# Patient Record
Sex: Female | Born: 1985 | Race: White | Hispanic: No | Marital: Married | Smoking: Never smoker
Health system: Southern US, Community
[De-identification: ages and names within clinical notes are randomized; demographics above are authoritative.]

## PROBLEM LIST (undated history)

## (undated) ENCOUNTER — Inpatient Hospital Stay (HOSPITAL_COMMUNITY): Payer: Self-pay

## (undated) DIAGNOSIS — J45909 Unspecified asthma, uncomplicated: Secondary | ICD-10-CM

## (undated) DIAGNOSIS — R569 Unspecified convulsions: Secondary | ICD-10-CM

## (undated) DIAGNOSIS — L309 Dermatitis, unspecified: Secondary | ICD-10-CM

## (undated) DIAGNOSIS — Z789 Other specified health status: Secondary | ICD-10-CM

## (undated) DIAGNOSIS — IMO0002 Reserved for concepts with insufficient information to code with codable children: Secondary | ICD-10-CM

## (undated) DIAGNOSIS — T8859XA Other complications of anesthesia, initial encounter: Secondary | ICD-10-CM

## (undated) DIAGNOSIS — T4145XA Adverse effect of unspecified anesthetic, initial encounter: Secondary | ICD-10-CM

## (undated) HISTORY — PX: NO PAST SURGERIES: SHX2092

## (undated) HISTORY — DX: Adverse effect of unspecified anesthetic, initial encounter: T41.45XA

## (undated) HISTORY — PX: HAND SURGERY: SHX662

## (undated) HISTORY — DX: Unspecified convulsions: R56.9

## (undated) HISTORY — DX: Unspecified asthma, uncomplicated: J45.909

## (undated) HISTORY — PX: WISDOM TOOTH EXTRACTION: SHX21

## (undated) HISTORY — DX: Reserved for concepts with insufficient information to code with codable children: IMO0002

## (undated) HISTORY — DX: Dermatitis, unspecified: L30.9

## (undated) HISTORY — DX: Other complications of anesthesia, initial encounter: T88.59XA

---

## 2007-07-30 ENCOUNTER — Emergency Department (HOSPITAL_COMMUNITY): Admission: EM | Admit: 2007-07-30 | Discharge: 2007-07-30 | Payer: Self-pay | Admitting: Emergency Medicine

## 2009-01-26 ENCOUNTER — Encounter: Admission: RE | Admit: 2009-01-26 | Discharge: 2009-01-26 | Payer: Self-pay | Admitting: Family Medicine

## 2010-06-14 ENCOUNTER — Other Ambulatory Visit: Admission: RE | Admit: 2010-06-14 | Discharge: 2010-06-14 | Payer: Self-pay | Admitting: Obstetrics and Gynecology

## 2011-06-15 ENCOUNTER — Other Ambulatory Visit (HOSPITAL_COMMUNITY)
Admission: RE | Admit: 2011-06-15 | Discharge: 2011-06-15 | Disposition: A | Discharge status: Home / Self Care | Payer: BC Managed Care – PPO | Source: Ambulatory Visit | Attending: Obstetrics and Gynecology | Admitting: Obstetrics and Gynecology

## 2011-06-15 DIAGNOSIS — Z113 Encounter for screening for infections with a predominantly sexual mode of transmission: Secondary | ICD-10-CM | POA: Insufficient documentation

## 2011-06-15 DIAGNOSIS — Z01419 Encounter for gynecological examination (general) (routine) without abnormal findings: Secondary | ICD-10-CM | POA: Insufficient documentation

## 2011-12-28 ENCOUNTER — Other Ambulatory Visit: Payer: Self-pay | Admitting: Obstetrics and Gynecology

## 2011-12-28 DIAGNOSIS — N979 Female infertility, unspecified: Secondary | ICD-10-CM

## 2012-01-04 ENCOUNTER — Ambulatory Visit (HOSPITAL_COMMUNITY)
Admission: RE | Admit: 2012-01-04 | Discharge: 2012-01-04 | Disposition: A | Discharge status: Home / Self Care | Payer: BC Managed Care – PPO | Source: Ambulatory Visit | Attending: Obstetrics and Gynecology | Admitting: Obstetrics and Gynecology

## 2012-01-04 DIAGNOSIS — N979 Female infertility, unspecified: Secondary | ICD-10-CM

## 2012-01-04 MED ORDER — IOHEXOL 300 MG/ML  SOLN: 5.0000 mL | Freq: Once | INTRAMUSCULAR | Status: AC | PRN

## 2012-03-22 ENCOUNTER — Encounter: Payer: Self-pay | Admitting: Physician Assistant

## 2012-03-22 ENCOUNTER — Ambulatory Visit (INDEPENDENT_AMBULATORY_CARE_PROVIDER_SITE_OTHER): Payer: BC Managed Care – PPO | Admitting: Physician Assistant

## 2012-03-22 VITALS — BP 132/77 | HR 73 | Temp 98.0°F | Resp 16 | Ht 65.5 in | Wt 148.0 lb

## 2012-03-22 DIAGNOSIS — L299 Pruritus, unspecified: Secondary | ICD-10-CM

## 2012-03-22 DIAGNOSIS — L255 Unspecified contact dermatitis due to plants, except food: Secondary | ICD-10-CM

## 2012-03-22 DIAGNOSIS — R21 Rash and other nonspecific skin eruption: Secondary | ICD-10-CM

## 2012-03-22 DIAGNOSIS — L237 Allergic contact dermatitis due to plants, except food: Secondary | ICD-10-CM

## 2012-03-22 LAB — GLUCOSE, POCT (MANUAL RESULT ENTRY): POC Glucose: 95

## 2012-03-22 MED ORDER — TRIAMCINOLONE ACETONIDE 0.1 % EX CREA: TOPICAL_CREAM | CUTANEOUS | Status: AC

## 2012-03-22 MED ORDER — PREDNISONE 20 MG PO TABS: ORAL_TABLET | ORAL | Status: AC

## 2012-03-22 NOTE — Progress Notes (Signed)
Patient ID: Carol Rush MRN: [id number], DOB: [birth year], 26 y.o. Date of Encounter: 03/22/2012, 5:41 PM  Primary Physician: No primary provider on file.  Chief Complaint: Pruritic rash  HPI: 26 y.o. year old female with presents with 3 day history of mildly erythematous pruritic rash along bilateral arms, abdomen, and legs. Patient was washing her dog that had been out in the woods prior to the development of the rash. Known poison ivy in the vicinity. Has washed all clothing and linens that have been exposed, including the dog. Lesions are drying secondary to increased washing with soap. Has tried Benadryl and Zyrtec. Patient is otherwise doing well without issues or complaints.  Past Medical History  Diagnosis Date  . Eczema      Home Meds: Prior to Admission medications   Not on File    Allergies: No Known Allergies  History   Social History  . Marital Status: Married    Spouse Name: N/A    Number of Children: N/A  . Years of Education: N/A   Occupational History  . Not on file.   Social History Main Topics  . Smoking status: Never Smoker   . Smokeless tobacco: Not on file  . Alcohol Use: Not on file  . Drug Use: Not on file  . Sexually Active: Not on file   Other Topics Concern  . Not on file   Social History Narrative  . No narrative on file     Review of Systems: Constitutional: negative for chills, fever, night sweats, weight changes, or fatigue  HEENT: negative for vision changes, hearing loss, congestion, rhinorrhea, ST, epistaxis, or sinus pressure Cardiovascular: negative for chest pain or palpitations Respiratory: negative for hemoptysis, wheezing, shortness of breath, or cough Abdominal: negative for abdominal pain, nausea, vomiting, diarrhea, or constipation Dermatological: see above Neurologic: negative for headache, dizziness, or syncope   Physical Exam: Blood pressure 132/77, pulse 73, temperature 98 F (36.7 C), temperature source  Oral, resp. rate 16, height 5' 5.5" (1.664 m), weight 148 lb (67.132 kg), last menstrual period 02/27/2012., Body mass index is 24.25 kg/(m^2). General: Well developed, well nourished, in no acute distress. Head: Normocephalic, atraumatic, eyes without discharge, sclera non-icteric, nares are without discharge. Bilateral auditory canals clear, TM's are without perforation, pearly grey and translucent with reflective cone of light bilaterally. Oral cavity moist, posterior pharynx without exudate, erythema, peritonsillar abscess, or post nasal drip.  Neck: Supple. No thyromegaly. Full ROM. No lymphadenopathy. Lungs: Clear bilaterally to auscultation without wheezes, rales, or rhonchi. Breathing is unlabored. Heart: RRR with S1 S2. No murmurs, rubs, or gallops appreciated. Msk:  Strength and tone normal for age. Extremities/Skin: Warm and dry. Multiple vesicular lesions along bilateral arms, legs, and abdomen consistent with poison ivy. No secondary infections present. No clubbing or cyanosis. No edema.  Neuro: Alert and oriented X 3. Moves all extremities spontaneously. Gait is normal. CNII-XII grossly in tact. Psych:  Responds to questions appropriately with a normal affect.   Labs: Results for orders placed in visit on 03/22/12  GLUCOSE, POCT (MANUAL RESULT ENTRY)      Component Value Range   POC Glucose 95       ASSESSMENT AND PLAN:  26 y.o. year old female with poison ivy -Prednisone 20 mg #18 3x3, 2x3, 1x3 no RF -Triamcinolone topical cream 0.1% Apply to affected area bid #45 grams RF 4 -Zyrtec -Zantac -Benadryl -RTC 10 days if symptoms persist, sooner if they worsen   Signed, Eula Listen, PA-C 03/22/2012  5:41 PM

## 2012-06-11 ENCOUNTER — Ambulatory Visit (INDEPENDENT_AMBULATORY_CARE_PROVIDER_SITE_OTHER): Payer: BC Managed Care – PPO | Admitting: Physician Assistant

## 2012-06-11 VITALS — BP 108/68 | HR 64 | Temp 98.6°F | Resp 16 | Ht 65.5 in | Wt 150.8 lb

## 2012-06-11 DIAGNOSIS — L239 Allergic contact dermatitis, unspecified cause: Secondary | ICD-10-CM

## 2012-06-11 DIAGNOSIS — L03119 Cellulitis of unspecified part of limb: Secondary | ICD-10-CM

## 2012-06-11 DIAGNOSIS — T6391XA Toxic effect of contact with unspecified venomous animal, accidental (unintentional), initial encounter: Secondary | ICD-10-CM

## 2012-06-11 DIAGNOSIS — L259 Unspecified contact dermatitis, unspecified cause: Secondary | ICD-10-CM

## 2012-06-11 DIAGNOSIS — T63441A Toxic effect of venom of bees, accidental (unintentional), initial encounter: Secondary | ICD-10-CM

## 2012-06-11 DIAGNOSIS — IMO0002 Reserved for concepts with insufficient information to code with codable children: Secondary | ICD-10-CM

## 2012-06-11 MED ORDER — PREDNISONE 20 MG PO TABS: ORAL_TABLET | ORAL | Status: AC

## 2012-06-11 MED ORDER — EPINEPHRINE 0.3 MG/0.3ML IJ DEVI: 0.3000 mg | Freq: Once | INTRAMUSCULAR | Status: DC

## 2012-06-11 MED ORDER — CEPHALEXIN 500 MG PO CAPS: 500.0000 mg | ORAL_CAPSULE | Freq: Two times a day (BID) | ORAL | Status: AC

## 2012-06-11 NOTE — Progress Notes (Signed)
  Subjective:    Patient ID: Carol Rush, female    DOB: May 09, 1986, 26 y.o.   MRN: [id number]  HPI Patient presents with wasp sting on 06/09/12.  Says she washed the area with soap and water and put ice on it. Since then she has had increasing redness, warmth, and swelling.  She has been taking benadryl 25 mg q4hours which has helped with the itching but has helped warmth or swelling.  She has continued using ice. No fevers, chills, nausea, vomiting, abdominal pain, headache, or drainage from the wound.     Review of Systems  All other systems reviewed and are negative.       Objective:   Physical Exam  Constitutional: She is oriented to person, place, and time. She appears well-developed and well-nourished.  HENT:  Head: Normocephalic and atraumatic.  Right Ear: External ear normal.  Left Ear: External ear normal.  Eyes: Conjunctivae are normal.  Neck: Normal range of motion.  Cardiovascular: Normal rate, regular rhythm and normal heart sounds.   Pulmonary/Chest: Effort normal and breath sounds normal.  Neurological: She is alert and oriented to person, place, and time.  Skin:     Psychiatric: She has a normal mood and affect. Her behavior is normal. Judgment and thought content normal.          Assessment & Plan:   1. Bee sting  EPINEPHrine (EPIPEN) 0.3 mg/0.3 mL DEVI  2. Allergic dermatitis  predniSONE (DELTASONE) 20 MG tablet  3. Cellulitis of arm  cephALEXin (KEFLEX) 500 MG capsule   Will treat with prednisone taper.  Continue benadryl 50 mg qhs and Zyrtec daily qam.   Cover with Keflex 500 mg bid x 7 days.   Follow up if symptoms worsen or fail to improve.

## 2012-11-08 LAB — OB RESULTS CONSOLE RPR: RPR: NONREACTIVE

## 2012-11-08 LAB — OB RESULTS CONSOLE RUBELLA ANTIBODY, IGM: Rubella: NON-IMMUNE/NOT IMMUNE

## 2012-11-08 LAB — OB RESULTS CONSOLE HIV ANTIBODY (ROUTINE TESTING): HIV: NONREACTIVE

## 2013-04-15 ENCOUNTER — Institutional Professional Consult (permissible substitution): Payer: BC Managed Care – PPO | Admitting: Pediatrics

## 2013-04-18 ENCOUNTER — Ambulatory Visit (INDEPENDENT_AMBULATORY_CARE_PROVIDER_SITE_OTHER): Payer: BC Managed Care – PPO | Admitting: Pediatrics

## 2013-04-18 DIAGNOSIS — Z7681 Expectant parent(s) prebirth pediatrician visit: Secondary | ICD-10-CM

## 2013-04-18 NOTE — Progress Notes (Signed)
Met with expectant first-time mother,  Due July 23rd, 2014, unknown gender Discussed practice logistics, clinic hours, after hours contact Well child schedule, vaccination schedule Answered questions

## 2013-05-06 LAB — OB RESULTS CONSOLE GBS: GBS: NEGATIVE

## 2013-06-03 ENCOUNTER — Encounter (HOSPITAL_COMMUNITY): Payer: Self-pay | Admitting: *Deleted

## 2013-06-03 ENCOUNTER — Inpatient Hospital Stay (HOSPITAL_COMMUNITY)
Admission: AD | Admit: 2013-06-03 | Discharge: 2013-06-03 | Disposition: A | Discharge status: Home / Self Care | Payer: BC Managed Care – PPO | Source: Ambulatory Visit | Attending: Obstetrics and Gynecology | Admitting: Obstetrics and Gynecology

## 2013-06-03 DIAGNOSIS — O479 False labor, unspecified: Secondary | ICD-10-CM | POA: Insufficient documentation

## 2013-06-03 HISTORY — DX: Other specified health status: Z78.9

## 2013-06-03 NOTE — MAU Note (Signed)
Pt C/O uc's since 2000 last night, more intense than before, denies bleeding or LOF.

## 2013-06-04 ENCOUNTER — Encounter (HOSPITAL_COMMUNITY): Payer: Self-pay | Admitting: *Deleted

## 2013-06-04 ENCOUNTER — Inpatient Hospital Stay (HOSPITAL_COMMUNITY): Payer: BC Managed Care – PPO | Admitting: Anesthesiology

## 2013-06-04 ENCOUNTER — Inpatient Hospital Stay (HOSPITAL_COMMUNITY)
Admission: AD | Admit: 2013-06-04 | Discharge: 2013-06-06 | DRG: 373 | Disposition: A | Discharge status: Home / Self Care | Payer: BC Managed Care – PPO | Source: Ambulatory Visit | Attending: Obstetrics and Gynecology | Admitting: Obstetrics and Gynecology

## 2013-06-04 ENCOUNTER — Encounter (HOSPITAL_COMMUNITY): Payer: Self-pay | Admitting: Anesthesiology

## 2013-06-04 LAB — CBC
MCH: 30.4 pg (ref 26.0–34.0)
MCHC: 34.8 g/dL (ref 30.0–36.0)
MCV: 87.2 fL (ref 78.0–100.0)
Platelets: 206 10*3/uL (ref 150–400)
RDW: 12.4 % (ref 11.5–15.5)

## 2013-06-04 LAB — TYPE AND SCREEN
ABO/RH(D): B POS
Antibody Screen: NEGATIVE

## 2013-06-04 LAB — ABO/RH: ABO/RH(D): B POS

## 2013-06-04 MED ORDER — ONDANSETRON HCL 4 MG/2ML IJ SOLN
4.0000 mg | Freq: Four times a day (QID) | INTRAMUSCULAR | Status: DC | PRN
  Administered 2013-06-04: 4 mg via INTRAVENOUS
  Filled 2013-06-04: qty 2

## 2013-06-04 MED ORDER — OXYTOCIN 40 UNITS IN LACTATED RINGERS INFUSION - SIMPLE MED: 1.0000 m[IU]/min | INTRAVENOUS | Status: DC

## 2013-06-04 MED ORDER — SENNOSIDES-DOCUSATE SODIUM 8.6-50 MG PO TABS
2.0000 | ORAL_TABLET | Freq: Every day | ORAL | Status: DC
  Administered 2013-06-05: 2 via ORAL

## 2013-06-04 MED ORDER — ONDANSETRON HCL 4 MG/2ML IJ SOLN: 4.0000 mg | INTRAMUSCULAR | Status: DC | PRN

## 2013-06-04 MED ORDER — FENTANYL 2.5 MCG/ML BUPIVACAINE 1/10 % EPIDURAL INFUSION (WH - ANES)
INTRAMUSCULAR | Status: DC | PRN
  Administered 2013-06-04: 14 mL/h via EPIDURAL

## 2013-06-04 MED ORDER — CITRIC ACID-SODIUM CITRATE 334-500 MG/5ML PO SOLN: 30.0000 mL | ORAL | Status: DC | PRN

## 2013-06-04 MED ORDER — PRENATAL MULTIVITAMIN CH
1.0000 | ORAL_TABLET | Freq: Every day | ORAL | Status: DC
  Administered 2013-06-05: 1 via ORAL
  Filled 2013-06-04: qty 1

## 2013-06-04 MED ORDER — WITCH HAZEL-GLYCERIN EX PADS: 1.0000 "application " | MEDICATED_PAD | CUTANEOUS | Status: DC | PRN

## 2013-06-04 MED ORDER — IBUPROFEN 600 MG PO TABS
600.0000 mg | ORAL_TABLET | Freq: Four times a day (QID) | ORAL | Status: DC
  Administered 2013-06-04 – 2013-06-06 (×7): 600 mg via ORAL
  Filled 2013-06-04 (×6): qty 1

## 2013-06-04 MED ORDER — LIDOCAINE HCL (PF) 1 % IJ SOLN
INTRAMUSCULAR | Status: DC | PRN
  Administered 2013-06-04 (×2): 4 mL

## 2013-06-04 MED ORDER — DIPHENHYDRAMINE HCL 50 MG/ML IJ SOLN: 12.5000 mg | INTRAMUSCULAR | Status: DC | PRN

## 2013-06-04 MED ORDER — MEDROXYPROGESTERONE ACETATE 150 MG/ML IM SUSP: 150.0000 mg | INTRAMUSCULAR | Status: DC | PRN

## 2013-06-04 MED ORDER — ONDANSETRON HCL 4 MG PO TABS: 4.0000 mg | ORAL_TABLET | ORAL | Status: DC | PRN

## 2013-06-04 MED ORDER — LACTATED RINGERS IV SOLN
500.0000 mL | INTRAVENOUS | Status: DC | PRN
  Administered 2013-06-04: 1000 mL via INTRAVENOUS

## 2013-06-04 MED ORDER — LIDOCAINE HCL (PF) 1 % IJ SOLN
30.0000 mL | INTRAMUSCULAR | Status: DC | PRN
  Filled 2013-06-04 (×2): qty 30

## 2013-06-04 MED ORDER — OXYTOCIN 40 UNITS IN LACTATED RINGERS INFUSION - SIMPLE MED
62.5000 mL/h | INTRAVENOUS | Status: DC
  Filled 2013-06-04: qty 1000

## 2013-06-04 MED ORDER — OXYCODONE-ACETAMINOPHEN 5-325 MG PO TABS: 1.0000 | ORAL_TABLET | ORAL | Status: DC | PRN

## 2013-06-04 MED ORDER — BENZOCAINE-MENTHOL 20-0.5 % EX AERO
1.0000 "application " | INHALATION_SPRAY | CUTANEOUS | Status: DC | PRN
  Filled 2013-06-04: qty 56

## 2013-06-04 MED ORDER — TETANUS-DIPHTH-ACELL PERTUSSIS 5-2.5-18.5 LF-MCG/0.5 IM SUSP: 0.5000 mL | Freq: Once | INTRAMUSCULAR | Status: DC

## 2013-06-04 MED ORDER — FENTANYL 2.5 MCG/ML BUPIVACAINE 1/10 % EPIDURAL INFUSION (WH - ANES)
14.0000 mL/h | INTRAMUSCULAR | Status: DC | PRN
  Administered 2013-06-04: 14 mL/h via EPIDURAL
  Filled 2013-06-04 (×2): qty 125

## 2013-06-04 MED ORDER — EPHEDRINE 5 MG/ML INJ
10.0000 mg | INTRAVENOUS | Status: DC | PRN
  Filled 2013-06-04: qty 2

## 2013-06-04 MED ORDER — ACETAMINOPHEN 325 MG PO TABS: 650.0000 mg | ORAL_TABLET | ORAL | Status: DC | PRN

## 2013-06-04 MED ORDER — MEASLES, MUMPS & RUBELLA VAC ~~LOC~~ INJ
0.5000 mL | INJECTION | Freq: Once | SUBCUTANEOUS | Status: AC
  Administered 2013-06-06: 0.5 mL via SUBCUTANEOUS
  Filled 2013-06-04 (×2): qty 0.5

## 2013-06-04 MED ORDER — IBUPROFEN 600 MG PO TABS: 600.0000 mg | ORAL_TABLET | Freq: Four times a day (QID) | ORAL | Status: DC | PRN

## 2013-06-04 MED ORDER — PHENYLEPHRINE 40 MCG/ML (10ML) SYRINGE FOR IV PUSH (FOR BLOOD PRESSURE SUPPORT)
80.0000 ug | PREFILLED_SYRINGE | INTRAVENOUS | Status: DC | PRN
  Filled 2013-06-04: qty 2

## 2013-06-04 MED ORDER — DIBUCAINE 1 % RE OINT: 1.0000 "application " | TOPICAL_OINTMENT | RECTAL | Status: DC | PRN

## 2013-06-04 MED ORDER — LACTATED RINGERS IV SOLN
INTRAVENOUS | Status: DC
  Administered 2013-06-04 (×2): 1000 mL via INTRAVENOUS

## 2013-06-04 MED ORDER — LANOLIN HYDROUS EX OINT: TOPICAL_OINTMENT | CUTANEOUS | Status: DC | PRN

## 2013-06-04 MED ORDER — SIMETHICONE 80 MG PO CHEW: 80.0000 mg | CHEWABLE_TABLET | ORAL | Status: DC | PRN

## 2013-06-04 MED ORDER — FLEET ENEMA 7-19 GM/118ML RE ENEM: 1.0000 | ENEMA | RECTAL | Status: DC | PRN

## 2013-06-04 MED ORDER — DIPHENHYDRAMINE HCL 25 MG PO CAPS: 25.0000 mg | ORAL_CAPSULE | Freq: Four times a day (QID) | ORAL | Status: DC | PRN

## 2013-06-04 MED ORDER — OXYTOCIN BOLUS FROM INFUSION: 500.0000 mL | INTRAVENOUS | Status: DC

## 2013-06-04 MED ORDER — EPHEDRINE 5 MG/ML INJ
10.0000 mg | INTRAVENOUS | Status: DC | PRN
  Filled 2013-06-04: qty 4
  Filled 2013-06-04: qty 2

## 2013-06-04 MED ORDER — TERBUTALINE SULFATE 1 MG/ML IJ SOLN: 0.2500 mg | Freq: Once | INTRAMUSCULAR | Status: DC | PRN

## 2013-06-04 MED ORDER — LACTATED RINGERS IV SOLN: 500.0000 mL | Freq: Once | INTRAVENOUS | Status: DC

## 2013-06-04 MED ORDER — PHENYLEPHRINE 40 MCG/ML (10ML) SYRINGE FOR IV PUSH (FOR BLOOD PRESSURE SUPPORT)
80.0000 ug | PREFILLED_SYRINGE | INTRAVENOUS | Status: DC | PRN
  Filled 2013-06-04: qty 5
  Filled 2013-06-04: qty 2

## 2013-06-04 NOTE — Anesthesia Preprocedure Evaluation (Signed)

## 2013-06-04 NOTE — Anesthesia Procedure Notes (Signed)
Epidural Patient location during procedure: OB Start time: 06/04/2013 1:31 PM  Staffing Anesthesiologist: Babbette Dalesandro A. Performed by: anesthesiologist   Preanesthetic Checklist Completed: patient identified, site marked, surgical consent, pre-op evaluation, timeout performed, IV checked, risks and benefits discussed and monitors and equipment checked  Epidural Patient position: sitting Prep: site prepped and draped and DuraPrep Patient monitoring: continuous pulse ox and blood pressure Approach: midline Injection technique: LOR air  Needle:  Needle type: Tuohy  Needle gauge: 17 G Needle length: 9 cm and 9 Needle insertion depth: 4 cm Catheter type: closed end flexible Catheter size: 19 Gauge Catheter at skin depth: 9 cm Test dose: negative and Other  Assessment Events: blood not aspirated, injection not painful, no injection resistance, negative IV test and no paresthesia  Additional Notes Patient identified. Risks and benefits discussed including failed block, incomplete  Pain control, post dural puncture headache, nerve damage, paralysis, blood pressure Changes, nausea, vomiting, reactions to medications-both toxic and allergic and post Partum back pain. All questions were answered. Patient expressed understanding and wished to proceed. Sterile technique was used throughout procedure. Epidural site was Dressed with sterile barrier dressing. No paresthesias, signs of intravascular injection Or signs of intrathecal spread were encountered.  Patient was more comfortable after the epidural was dosed. Please see RN's note for documentation of vital signs and FHR which are stable.

## 2013-06-04 NOTE — Progress Notes (Signed)
Pt comfortable w/ epidural  FHT reassuring Toco Q3 Cvx 6cm per RN check  A/P:  Labor Exp mngt

## 2013-06-04 NOTE — Progress Notes (Signed)
Pt comfortable  FHT reassuring Toco Q2-3 Cvx loose right lip, slips behind head w/ push.  0 station  A/P:  Labor Exp [facility]

## 2013-06-04 NOTE — Progress Notes (Signed)
SVD of vigerous female infant w/ apgars of 9,9.  Placenta delivered spontaneous w/ 3VC.   2nd degree & right labial lac repaired w/ 3-0 vicryl rapide.  Fundus firm.  EBL 350cc .

## 2013-06-04 NOTE — H&P (Signed)
Carol Rush is a 27 y.o. female presenting for labor.  Regular contractions that began this morning.  No vb or lof.   History OB History   Grav Para Term Preterm Abortions TAB SAB Ect Mult Living   1              Past Medical History  Diagnosis Date  . Eczema   . Medical history non-contributory    Past Surgical History  Procedure Laterality Date  . No past surgeries     Family History: family history is not on file. Social History:  reports that she has never smoked. She does not have any smokeless tobacco history on file. She reports that she does not drink alcohol or use illicit drugs.   Prenatal Transfer Tool  Maternal Diabetes: No Genetic Screening: Declined Maternal Ultrasounds/Referrals: Normal Fetal Ultrasounds or other Referrals:  None Maternal Substance Abuse:  No Significant Maternal Medications:  None Significant Maternal Lab Results:  None Other Comments:  None  ROS  Dilation: 4.5 Effacement (%): 90 Station: -2 Exam by:: dr Renaldo Fiddler Blood pressure 120/81, pulse 70, temperature 98.5 F (36.9 C), temperature source Oral, resp. rate 18, height 5\' 6"  (1.676 m), weight 77.111 kg (170 lb), last menstrual period 05/31/2012. Exam Physical Exam  Prenatal labs: ABO, Rh: B/Positive/-- (12/26 0000) Antibody: Negative (12/26 0000) Rubella: Nonimmune (12/26 0000) RPR: Nonreactive (12/26 0000)  HBsAg: Negative (12/26 0000)  HIV: Non-reactive (12/26 0000)  GBS: Negative (06/23 0000)   Assessment/Plan: Admit Arom - clear Exp mngt Epidural prn   Dmiyah Liscano 06/04/2013, 12:41 PM

## 2013-06-05 LAB — CBC
MCHC: 34.8 g/dL (ref 30.0–36.0)
RDW: 12.4 % (ref 11.5–15.5)

## 2013-06-05 NOTE — Progress Notes (Cosign Needed)
Post Partum Day 1 Subjective: no complaints, up ad lib, voiding and tolerating PO  Objective: Blood pressure 119/76, pulse 62, temperature 98.1 F (36.7 C), temperature source Oral, resp. rate 20, height 5\' 6"  (1.676 m), weight 170 lb (77.111 kg), last menstrual period 05/31/2012, SpO2 95.00%, unknown if currently breastfeeding.  Physical Exam:  General: alert and cooperative Lochia: appropriate Uterine Fundus: firm Incision: perineum intact, small labial edema DVT Evaluation: No evidence of DVT seen on physical exam. Negative Homan's sign. No cords or calf tenderness. No significant calf/ankle edema.   Recent Labs  06/04/13 1106 06/05/13 0610  HGB 12.6 10.5*  HCT 36.2 30.2*    Assessment/Plan: Plan for discharge tomorrow and Circumcision prior to discharge   LOS: 1 day   Kijana Estock G 06/05/2013, 8:03 AM

## 2013-06-05 NOTE — Progress Notes (Signed)
Admission nutrition screen triggered for weight loss. Pt with a net weight gain of 22 Lbs. Patients chart reviewed and assessed  for nutritional risk. Patient is determined to be at low nutrition  risk.   Elisabeth Cara M.Odis Luster LDN Neonatal Nutrition Support Specialist Pager [phone]

## 2013-06-06 ENCOUNTER — Encounter (HOSPITAL_COMMUNITY): Payer: Self-pay

## 2013-06-06 MED ORDER — IBUPROFEN 600 MG PO TABS: 600.0000 mg | ORAL_TABLET | Freq: Four times a day (QID) | ORAL | Status: DC

## 2013-06-06 NOTE — Discharge Summary (Signed)
Obstetric Discharge Summary Reason for Admission: onset of labor Prenatal Procedures: ultrasound Intrapartum Procedures: spontaneous vaginal delivery Postpartum Procedures: none Complications-Operative and Postpartum: 1 degree perineal laceration Hemoglobin  Date Value Range Status  06/05/2013 10.5* 12.0 - 15.0 g/dL Final     HCT  Date Value Range Status  06/05/2013 30.2* 36.0 - 46.0 % Final    Physical Exam:  General: alert and cooperative Lochia: appropriate Uterine Fundus: firm Incision: perineum intact DVT Evaluation: No evidence of DVT seen on physical exam. Negative Homan's sign. No cords or calf tenderness. No significant calf/ankle edema.  Discharge Diagnoses: Term Pregnancy-delivered  Discharge Information: Date: 06/06/2013 Activity: pelvic rest Diet: routine Medications: PNV and Ibuprofen Condition: stable Instructions: refer to practice specific booklet Discharge to: home   Newborn Data: Live born female  Birth Weight: 8 lb 8.5 oz (3870 g) APGAR: 9, 9  Home with mother.  Carol Rush G 06/06/2013, 8:33 AM

## 2013-06-07 NOTE — Progress Notes (Signed)
Post discharge chart review completed.  

## 2013-06-10 NOTE — Anesthesia Postprocedure Evaluation (Signed)
  Anesthesia Post-op Note  Patient: Carol Rush  Procedure(s) Performed: Lumbar Epidural for L&D  Complications: No apparent anesthesia complications

## 2014-09-15 ENCOUNTER — Encounter (HOSPITAL_COMMUNITY): Payer: Self-pay

## 2015-08-26 LAB — OB RESULTS CONSOLE ABO/RH: RH TYPE: POSITIVE

## 2015-08-26 LAB — OB RESULTS CONSOLE HIV ANTIBODY (ROUTINE TESTING): HIV: NONREACTIVE

## 2015-08-26 LAB — OB RESULTS CONSOLE GC/CHLAMYDIA
Chlamydia: NEGATIVE
GC PROBE AMP, GENITAL: NEGATIVE

## 2015-08-26 LAB — OB RESULTS CONSOLE RUBELLA ANTIBODY, IGM: Rubella: IMMUNE

## 2015-08-26 LAB — OB RESULTS CONSOLE HEPATITIS B SURFACE ANTIGEN: Hepatitis B Surface Ag: NEGATIVE

## 2015-08-26 LAB — OB RESULTS CONSOLE RPR: RPR: NONREACTIVE

## 2015-08-26 LAB — OB RESULTS CONSOLE ANTIBODY SCREEN: Antibody Screen: NEGATIVE

## 2015-11-15 NOTE — L&D Delivery Note (Signed)
SVD of VMI at 1454 on 03/28/16.  EBL 250cc.  Placenta to L&D. Head delivered LOA with tight nuchal x 1 which was delivered through.  Body delivered atraumatically.  Baby to abdomen.  Cord was clamped and cut.  Placenta delivered S/I/3VC with marginal cord insertion.  Fundus was firmed with pitocin and massage.  Perineum intact. Mom and baby stable.  Mitchel HonourMegan Jakalyn Kratky, DO

## 2015-12-01 ENCOUNTER — Other Ambulatory Visit (HOSPITAL_COMMUNITY): Payer: Self-pay | Admitting: Obstetrics and Gynecology

## 2015-12-01 ENCOUNTER — Encounter (HOSPITAL_COMMUNITY): Payer: Self-pay | Admitting: Obstetrics and Gynecology

## 2015-12-01 DIAGNOSIS — Z3689 Encounter for other specified antenatal screening: Secondary | ICD-10-CM

## 2015-12-01 DIAGNOSIS — O283 Abnormal ultrasonic finding on antenatal screening of mother: Secondary | ICD-10-CM

## 2015-12-01 DIAGNOSIS — Z3A24 24 weeks gestation of pregnancy: Secondary | ICD-10-CM

## 2015-12-08 ENCOUNTER — Encounter (HOSPITAL_COMMUNITY): Payer: Self-pay

## 2015-12-08 ENCOUNTER — Ambulatory Visit (HOSPITAL_COMMUNITY)
Admission: RE | Admit: 2015-12-08 | Discharge: 2015-12-08 | Disposition: A | Discharge status: Home / Self Care | Payer: BLUE CROSS/BLUE SHIELD | Source: Ambulatory Visit | Attending: Obstetrics and Gynecology | Admitting: Obstetrics and Gynecology

## 2015-12-08 ENCOUNTER — Other Ambulatory Visit (HOSPITAL_COMMUNITY): Payer: Self-pay | Admitting: Obstetrics and Gynecology

## 2015-12-08 DIAGNOSIS — Z3A24 24 weeks gestation of pregnancy: Secondary | ICD-10-CM

## 2015-12-08 DIAGNOSIS — IMO0002 Reserved for concepts with insufficient information to code with codable children: Secondary | ICD-10-CM

## 2015-12-08 DIAGNOSIS — Z3689 Encounter for other specified antenatal screening: Secondary | ICD-10-CM

## 2015-12-08 DIAGNOSIS — O43122 Velamentous insertion of umbilical cord, second trimester: Secondary | ICD-10-CM

## 2015-12-08 DIAGNOSIS — O283 Abnormal ultrasonic finding on antenatal screening of mother: Secondary | ICD-10-CM

## 2015-12-08 DIAGNOSIS — Z3A23 23 weeks gestation of pregnancy: Secondary | ICD-10-CM

## 2015-12-08 DIAGNOSIS — Z36 Encounter for antenatal screening of mother: Secondary | ICD-10-CM | POA: Insufficient documentation

## 2015-12-08 NOTE — Progress Notes (Signed)
MFM Consult, Staff Note:  There is an active singleton fetus without apparent dysmorphic features or markers of aneuploidy on targeted evaluation. The biometry is symmetric and agrees with the established dates.   The posterior placenta has a marginal cord insertion at the uterine fundus and no apparent velamentous (sub-membranous) cord insertion. There is no previa or vasa previa and the placenta is not low-lying. Marginal cord insertion rarely may be associated with fetal growth restriction and/or evolve into a velamentous insertion which is associated with cord rupture during placental extraction at delivery.   I recommend fetal growth assessment and evaluation of placenta/cord in 6 weeks. If no evidence of fetal growth restriction at the time of that exam, I recommend another growth ultrasound at approximately [redacted] weeks GA.   I explained the limitations of ultrasound for the diagnosis or exclusion of aneuploidy or anomalies. Today's discussion was limited to pertinent findings on the ultrasound.   As a courtesy, a repeat ultrasound for fetal growth and placenta/cord assessment has been scheduled in 6 weeks as this may evolve into velamentous insertion. Thank you for allowing our unit to contribute to your patient's care.  Time Spent:  I spent in excess of 20 minutes in consultation with this patient to review records, evaluate her case, and provide her with an adequate discussion and education.  More than 50% of this time was spent in direct face-to-face counseling.  It was a pleasure seeing your patient in the office today.  Thank you for consultation. Please do not hesitate to contact our service for any further questions.   Thank you,  Louann Sjogren Gaynelle Arabian, Louann Sjogren, MD, MS, FACOG Assistant Professor Section of Maternal-Fetal Medicine [facility]

## 2015-12-08 NOTE — Consult Note (Signed)
MFM Consult, Staff Note:  There is an active singleton fetus without apparent dysmorphic features or markers of aneuploidy on targeted evaluation. The biometry is symmetric and agrees with the established dates.   The posterior placenta has a marginal cord insertion at the uterine fundus and no apparent velamentous (sub-membranous) cord insertion. There is no previa or vasa previa and the placenta is not low-lying. Marginal cord insertion rarely may be associated with fetal growth restriction and/or evolve into a velamentous insertion which is associated with cord rupture during placental extraction at delivery.   I recommend fetal growth assessment and evaluation of placenta/cord in 6 weeks. If no evidence of fetal growth restriction at the time of that exam, I recommend another growth ultrasound at approximately [redacted] weeks GA.   I explained the limitations of ultrasound for the diagnosis or exclusion of aneuploidy or anomalies. Today's discussion was limited to pertinent findings on the ultrasound.   As a courtesy, a repeat ultrasound for fetal growth and placenta/cord assessment has been scheduled in 6 weeks as this may evolve into velamentous insertion. Thank you for allowing our unit to contribute to your patient's care.  Time Spent:  I spent in excess of 20 minutes in consultation with this patient to review records, evaluate her case, and provide her with an adequate discussion and education.  More than 50% of this time was spent in direct face-to-face counseling.  It was a pleasure seeing your patient in the office today.  Thank you for consultation. Please do not hesitate to contact our service for any further questions.   Thank you,  Marcelina Mclaurin Morgan Linton Stolp   Golden Emile Morgan, MD, MS, FACOG Assistant Professor Section of Maternal-Fetal Medicine [employer]      

## 2015-12-09 ENCOUNTER — Encounter (HOSPITAL_COMMUNITY): Payer: Self-pay

## 2015-12-09 ENCOUNTER — Other Ambulatory Visit (HOSPITAL_COMMUNITY): Payer: Self-pay

## 2016-01-27 ENCOUNTER — Ambulatory Visit (HOSPITAL_COMMUNITY): Admission: RE | Admit: 2016-01-27 | Payer: BLUE CROSS/BLUE SHIELD | Source: Ambulatory Visit

## 2016-03-03 LAB — OB RESULTS CONSOLE GBS: STREP GROUP B AG: NEGATIVE

## 2016-03-25 ENCOUNTER — Telehealth (HOSPITAL_COMMUNITY): Payer: Self-pay | Admitting: *Deleted

## 2016-03-25 ENCOUNTER — Encounter (HOSPITAL_COMMUNITY): Payer: Self-pay | Admitting: *Deleted

## 2016-03-25 NOTE — Telephone Encounter (Signed)
Preadmission screen  

## 2016-03-26 ENCOUNTER — Inpatient Hospital Stay (HOSPITAL_COMMUNITY)
Admission: AD | Admit: 2016-03-26 | Discharge: 2016-03-26 | Disposition: A | Discharge status: Home / Self Care | Payer: BLUE CROSS/BLUE SHIELD | Source: Ambulatory Visit | Attending: Obstetrics and Gynecology | Admitting: Obstetrics and Gynecology

## 2016-03-26 ENCOUNTER — Encounter (HOSPITAL_COMMUNITY): Payer: Self-pay | Admitting: *Deleted

## 2016-03-26 NOTE — MAU Note (Signed)
Contractions since 630 pm. Denies vaginal bleeding or leaking any fluids

## 2016-03-26 NOTE — Progress Notes (Signed)
Dr Henderson Cloudomblin notified of patients presenting complaints. Orders to d/c home when fetal heart tracing is reactive and reassuring

## 2016-03-28 ENCOUNTER — Inpatient Hospital Stay (HOSPITAL_COMMUNITY): Payer: BLUE CROSS/BLUE SHIELD | Admitting: Anesthesiology

## 2016-03-28 ENCOUNTER — Inpatient Hospital Stay (HOSPITAL_COMMUNITY)
Admission: RE | Admit: 2016-03-28 | Discharge: 2016-03-29 | DRG: 775 | Disposition: A | Discharge status: Home / Self Care | Payer: BLUE CROSS/BLUE SHIELD | Source: Ambulatory Visit | Attending: Obstetrics & Gynecology | Admitting: Obstetrics & Gynecology

## 2016-03-28 ENCOUNTER — Encounter (HOSPITAL_COMMUNITY): Payer: Self-pay

## 2016-03-28 VITALS — BP 114/75 | HR 67 | Temp 98.4°F | Resp 20 | Ht 65.5 in | Wt 173.0 lb

## 2016-03-28 DIAGNOSIS — Z3A39 39 weeks gestation of pregnancy: Secondary | ICD-10-CM

## 2016-03-28 DIAGNOSIS — O43123 Velamentous insertion of umbilical cord, third trimester: Secondary | ICD-10-CM | POA: Diagnosis present

## 2016-03-28 DIAGNOSIS — Z818 Family history of other mental and behavioral disorders: Secondary | ICD-10-CM | POA: Diagnosis not present

## 2016-03-28 DIAGNOSIS — O9952 Diseases of the respiratory system complicating childbirth: Secondary | ICD-10-CM | POA: Diagnosis present

## 2016-03-28 DIAGNOSIS — Z3490 Encounter for supervision of normal pregnancy, unspecified, unspecified trimester: Secondary | ICD-10-CM

## 2016-03-28 DIAGNOSIS — J45909 Unspecified asthma, uncomplicated: Secondary | ICD-10-CM | POA: Diagnosis present

## 2016-03-28 DIAGNOSIS — Z833 Family history of diabetes mellitus: Secondary | ICD-10-CM

## 2016-03-28 DIAGNOSIS — Z8249 Family history of ischemic heart disease and other diseases of the circulatory system: Secondary | ICD-10-CM | POA: Diagnosis not present

## 2016-03-28 LAB — TYPE AND SCREEN
ABO/RH(D): B POS
Antibody Screen: NEGATIVE

## 2016-03-28 LAB — CBC
HEMATOCRIT: 32.1 % — AB (ref 36.0–46.0)
HEMOGLOBIN: 10.9 g/dL — AB (ref 12.0–15.0)
MCH: 27.5 pg (ref 26.0–34.0)
MCHC: 34 g/dL (ref 30.0–36.0)
MCV: 81.1 fL (ref 78.0–100.0)
PLATELETS: 223 10*3/uL (ref 150–400)
RBC: 3.96 MIL/uL (ref 3.87–5.11)
RDW: 13.2 % (ref 11.5–15.5)
WBC: 8.8 10*3/uL (ref 4.0–10.5)

## 2016-03-28 MED ORDER — LACTATED RINGERS IV SOLN
INTRAVENOUS | Status: DC
  Administered 2016-03-28 (×3): via INTRAVENOUS

## 2016-03-28 MED ORDER — FENTANYL 2.5 MCG/ML BUPIVACAINE 1/10 % EPIDURAL INFUSION (WH - ANES)
14.0000 mL/h | INTRAMUSCULAR | Status: DC | PRN
  Administered 2016-03-28: 14 mL/h via EPIDURAL
  Filled 2016-03-28: qty 125

## 2016-03-28 MED ORDER — DIPHENHYDRAMINE HCL 50 MG/ML IJ SOLN: 12.5000 mg | INTRAMUSCULAR | Status: DC | PRN

## 2016-03-28 MED ORDER — LACTATED RINGERS IV SOLN: 500.0000 mL | Freq: Once | INTRAVENOUS | Status: DC

## 2016-03-28 MED ORDER — OXYCODONE-ACETAMINOPHEN 5-325 MG PO TABS: 2.0000 | ORAL_TABLET | ORAL | Status: DC | PRN

## 2016-03-28 MED ORDER — CITRIC ACID-SODIUM CITRATE 334-500 MG/5ML PO SOLN: 30.0000 mL | ORAL | Status: DC | PRN

## 2016-03-28 MED ORDER — OXYTOCIN BOLUS FROM INFUSION: 500.0000 mL | INTRAVENOUS | Status: DC

## 2016-03-28 MED ORDER — ONDANSETRON HCL 4 MG/2ML IJ SOLN: 4.0000 mg | INTRAMUSCULAR | Status: DC | PRN

## 2016-03-28 MED ORDER — WITCH HAZEL-GLYCERIN EX PADS: 1.0000 "application " | MEDICATED_PAD | CUTANEOUS | Status: DC | PRN

## 2016-03-28 MED ORDER — COCONUT OIL OIL: 1.0000 "application " | TOPICAL_OIL | Status: DC | PRN

## 2016-03-28 MED ORDER — ACETAMINOPHEN 325 MG PO TABS
650.0000 mg | ORAL_TABLET | ORAL | Status: DC | PRN
  Administered 2016-03-28 – 2016-03-29 (×2): 650 mg via ORAL
  Filled 2016-03-28 (×2): qty 2

## 2016-03-28 MED ORDER — LIDOCAINE HCL (PF) 1 % IJ SOLN
30.0000 mL | INTRAMUSCULAR | Status: DC | PRN
  Filled 2016-03-28: qty 30

## 2016-03-28 MED ORDER — PHENYLEPHRINE 40 MCG/ML (10ML) SYRINGE FOR IV PUSH (FOR BLOOD PRESSURE SUPPORT)
80.0000 ug | PREFILLED_SYRINGE | INTRAVENOUS | Status: DC | PRN
  Filled 2016-03-28: qty 5

## 2016-03-28 MED ORDER — ONDANSETRON HCL 4 MG/2ML IJ SOLN
4.0000 mg | Freq: Four times a day (QID) | INTRAMUSCULAR | Status: DC | PRN
Start: 2016-03-28 — End: 2016-03-28

## 2016-03-28 MED ORDER — PHENYLEPHRINE 40 MCG/ML (10ML) SYRINGE FOR IV PUSH (FOR BLOOD PRESSURE SUPPORT)
80.0000 ug | PREFILLED_SYRINGE | INTRAVENOUS | Status: DC | PRN
  Filled 2016-03-28: qty 5
  Filled 2016-03-28: qty 10

## 2016-03-28 MED ORDER — OXYCODONE-ACETAMINOPHEN 5-325 MG PO TABS: 1.0000 | ORAL_TABLET | ORAL | Status: DC | PRN

## 2016-03-28 MED ORDER — EPHEDRINE 5 MG/ML INJ
10.0000 mg | INTRAVENOUS | Status: DC | PRN
  Filled 2016-03-28: qty 2

## 2016-03-28 MED ORDER — FLEET ENEMA 7-19 GM/118ML RE ENEM: 1.0000 | ENEMA | RECTAL | Status: DC | PRN

## 2016-03-28 MED ORDER — DIPHENHYDRAMINE HCL 25 MG PO CAPS: 25.0000 mg | ORAL_CAPSULE | Freq: Four times a day (QID) | ORAL | Status: DC | PRN

## 2016-03-28 MED ORDER — LIDOCAINE HCL (PF) 1 % IJ SOLN
INTRAMUSCULAR | Status: DC | PRN
  Administered 2016-03-28: 5 mL via EPIDURAL
  Administered 2016-03-28: 5 mL

## 2016-03-28 MED ORDER — TETANUS-DIPHTH-ACELL PERTUSSIS 5-2.5-18.5 LF-MCG/0.5 IM SUSP: 0.5000 mL | Freq: Once | INTRAMUSCULAR | Status: DC

## 2016-03-28 MED ORDER — PRENATAL MULTIVITAMIN CH
1.0000 | ORAL_TABLET | Freq: Every day | ORAL | Status: DC
  Administered 2016-03-29: 1 via ORAL
  Filled 2016-03-28: qty 1

## 2016-03-28 MED ORDER — OXYCODONE-ACETAMINOPHEN 5-325 MG PO TABS
1.0000 | ORAL_TABLET | ORAL | Status: DC | PRN
  Filled 2016-03-28: qty 1

## 2016-03-28 MED ORDER — BENZOCAINE-MENTHOL 20-0.5 % EX AERO
1.0000 "application " | INHALATION_SPRAY | CUTANEOUS | Status: DC | PRN
  Administered 2016-03-29: 1 via TOPICAL
  Filled 2016-03-28: qty 56

## 2016-03-28 MED ORDER — SIMETHICONE 80 MG PO CHEW
80.0000 mg | CHEWABLE_TABLET | ORAL | Status: DC | PRN
Start: 2016-03-28 — End: 2016-03-29

## 2016-03-28 MED ORDER — OXYTOCIN 40 UNITS IN LACTATED RINGERS INFUSION - SIMPLE MED
2.5000 [IU]/h | INTRAVENOUS | Status: DC
  Administered 2016-03-28: 2.5 [IU]/h via INTRAVENOUS
  Filled 2016-03-28: qty 1000

## 2016-03-28 MED ORDER — DIBUCAINE 1 % RE OINT: 1.0000 "application " | TOPICAL_OINTMENT | RECTAL | Status: DC | PRN

## 2016-03-28 MED ORDER — BUTORPHANOL TARTRATE 1 MG/ML IJ SOLN: 1.0000 mg | INTRAMUSCULAR | Status: DC | PRN

## 2016-03-28 MED ORDER — ZOLPIDEM TARTRATE 5 MG PO TABS: 5.0000 mg | ORAL_TABLET | Freq: Every evening | ORAL | Status: DC | PRN

## 2016-03-28 MED ORDER — SENNOSIDES-DOCUSATE SODIUM 8.6-50 MG PO TABS
2.0000 | ORAL_TABLET | ORAL | Status: DC
  Administered 2016-03-28: 2 via ORAL
  Filled 2016-03-28: qty 2

## 2016-03-28 MED ORDER — ONDANSETRON HCL 4 MG PO TABS: 4.0000 mg | ORAL_TABLET | ORAL | Status: DC | PRN

## 2016-03-28 MED ORDER — IBUPROFEN 600 MG PO TABS
600.0000 mg | ORAL_TABLET | Freq: Four times a day (QID) | ORAL | Status: DC
  Administered 2016-03-28 – 2016-03-29 (×5): 600 mg via ORAL
  Filled 2016-03-28 (×5): qty 1

## 2016-03-28 MED ORDER — ACETAMINOPHEN 325 MG PO TABS: 650.0000 mg | ORAL_TABLET | ORAL | Status: DC | PRN

## 2016-03-28 MED ORDER — LACTATED RINGERS IV SOLN: 500.0000 mL | INTRAVENOUS | Status: DC | PRN

## 2016-03-28 NOTE — Anesthesia Preprocedure Evaluation (Signed)
Anesthesia Evaluation  Patient identified by MRN, date of birth, ID band Patient awake    Reviewed: Allergy & Precautions, H&P , Patient's Chart, lab work & pertinent test results  History of Anesthesia Complications Negative for: history of anesthetic complications  Airway Mallampati: III  TM Distance: >3 FB Neck ROM: full    Dental no notable dental hx. (+) Teeth Intact   Pulmonary asthma ,    Pulmonary exam normal breath sounds clear to auscultation       Cardiovascular negative cardio ROS   Rhythm:regular Rate:Normal     Neuro/Psych negative neurological ROS  negative psych ROS   GI/Hepatic negative GI ROS, Neg liver ROS,   Endo/Other  negative endocrine ROS  Renal/GU negative Renal ROS  negative genitourinary   Musculoskeletal Eczema   Abdominal Normal abdominal exam  (+)   Peds  Hematology negative hematology ROS (+)   Anesthesia Other Findings   Reproductive/Obstetrics (+) Pregnancy                             Anesthesia Physical  Anesthesia Plan  ASA: II  Anesthesia Plan: Epidural   Post-op Pain Management:    Induction:   Airway Management Planned:   Additional Equipment:   Intra-op Plan:   Post-operative Plan:   Informed Consent: I have reviewed the patients History and Physical, chart, labs and discussed the procedure including the risks, benefits and alternatives for the proposed anesthesia with the patient or authorized representative who has indicated his/her understanding and acceptance.     Plan Discussed with: Anesthesiologist  Anesthesia Plan Comments:         Anesthesia Quick Evaluation

## 2016-03-28 NOTE — Anesthesia Procedure Notes (Signed)
Epidural Patient location during procedure: OB Start time: 03/28/2016 9:42 AM End time: 03/28/2016 9:54 AM  Staffing Anesthesiologist: Heather RobertsSINGER, Burke Terry Performed by: anesthesiologist   Preanesthetic Checklist Completed: patient identified, site marked, pre-op evaluation, timeout performed, IV checked, risks and benefits discussed and monitors and equipment checked  Epidural Patient position: sitting Prep: DuraPrep Patient monitoring: heart rate, cardiac monitor, continuous pulse ox and blood pressure Approach: midline Location: L2-L3 Injection technique: LOR saline  Needle:  Needle type: Tuohy  Needle gauge: 17 G Needle length: 9 cm Needle insertion depth: 5 cm Catheter size: 20 Guage Catheter at skin depth: 10 cm Test dose: negative and Other  Assessment Events: blood not aspirated, injection not painful, no injection resistance and negative IV test  Additional Notes Informed consent obtained prior to proceeding including risk of failure, 1% risk of PDPH, risk of minor discomfort and bruising.  Discussed rare but serious complications including epidural abscess, permanent nerve injury, epidural hematoma.  Discussed alternatives to epidural analgesia and patient desires to proceed.  Timeout performed pre-procedure verifying patient name, procedure, and platelet count.  Patient tolerated procedure well.

## 2016-03-28 NOTE — H&P (Signed)
Carol Rush is a 30 y.o. female presenting for elective IOL.  Antepartum course complicated by marginal cord insertion; nl growth u/s.  Negative GBS.  Maternal Medical History:  Reason for admission: Contractions.   Contractions: Onset was 1 week ago.   Frequency: rare.   Perceived severity is mild.    Fetal activity: Perceived fetal activity is normal.   Last perceived fetal movement was within the past hour.    Prenatal complications: no prenatal complications Prenatal Complications - Diabetes: none.    OB History    Gravida Para Term Preterm AB TAB SAB Ectopic Multiple Living   2 1 1       1      Past Medical History  Diagnosis Date  . Eczema   . Medical history non-contributory   . Complication of anesthesia     Dental anesthesia SEIZURES (General)  . Seizures (HCC)     dental anesthesia  . Asthma   . Marginal insertion of umbilical cord    Past Surgical History  Procedure Laterality Date  . No past surgeries    . Hand surgery    . Wisdom tooth extraction     Family History: family history includes Anxiety disorder in her mother; Cancer in her father, maternal grandfather, and paternal grandfather; Diabetes in her maternal grandfather; Heart disease in her paternal grandfather; Hypertension in her father and mother. Social History:  reports that she has never smoked. She does not have any smokeless tobacco history on file. She reports that she does not drink alcohol or use illicit drugs.   Prenatal Transfer Tool  Maternal Diabetes: No Genetic Screening: Normal Maternal Ultrasounds/Referrals: Normal Fetal Ultrasounds or other Referrals:  None Maternal Substance Abuse:  No Significant Maternal Medications:  None Significant Maternal Lab Results:  Lab values include: Group B Strep negative Other Comments:  None  ROS  Dilation: 3.5 Effacement (%): 50 Station: -2 Exam by:: Deysha Cartier Blood pressure 123/79, pulse 83, temperature 98.2 F (36.8 C), temperature  source Oral, height 5' 5.5" (1.664 m), weight 173 lb (78.472 kg), last menstrual period 06/21/2015, unknown if currently breastfeeding. Maternal Exam:  Uterine Assessment: Contraction strength is mild.  Contraction frequency is irregular.   Abdomen: Patient reports no abdominal tenderness. Fundal height is c/w dates.   Estimated fetal weight is 8#.   Fetal presentation: vertex  Introitus: Normal vulva. Amniotic fluid character: clear.  Pelvis: adequate for delivery.   Cervix: Cervix evaluated by digital exam.     Physical Exam  Constitutional: She is oriented to person, place, and time. She appears well-developed and well-nourished.  GI: Soft. There is no rebound and no guarding.  Neurological: She is alert and oriented to person, place, and time.  Skin: Skin is warm and dry.  Psychiatric: She has a normal mood and affect. Her behavior is normal.    Prenatal labs: ABO, Rh: B/Positive/-- (10/12 0000) Antibody: Negative (10/12 0000) Rubella: Immune (10/12 0000) RPR: Nonreactive (10/12 0000)  HBsAg: Negative (10/12 0000)  HIV: Non-reactive (10/12 0000)  GBS: Negative (04/20 0000)   Assessment/Plan: 30yo G2P1001 at 3451w5d for elective IOL -Add pitocin prn -Epidural if desired -Anticipate NSVD   Carol Rush 03/28/2016, 8:18 AM

## 2016-03-28 NOTE — Lactation Note (Signed)
This note was copied from a baby's chart. Lactation Consultation Note  Patient Name: Carol Rush ZOXWR'UToday's Date: 03/28/2016 Reason for consult: Initial assessment Baby at 3 hr of life. Experienced bf mom reports latcing is going well. She denies breast or nipple pain, voiced no concerns. She ebf her older child for 5 m then her supply dipped so she bf and formula until her was 8 m. Discussed baby behavior, feeding frequency, baby belly size, voids, wt loss, breast changes, and nipple care. Demonstrated manual expression, colostrum noted bilaterally, spoon in room. Given lactation handouts. Aware of OP services and support group.     Maternal Data Has patient been taught Hand Expression?: Yes Does the patient have breastfeeding experience prior to this delivery?: Yes  Feeding Feeding Type: Breast Fed Length of feed: 20 min  LATCH Score/Interventions                      Lactation Tools Discussed/Used WIC Program: No   Consult Status Consult Status: Follow-up Date: 03/29/16 Follow-up type: In-patient    Rulon Eisenmengerlizabeth E Fuad Forget 03/28/2016, 6:25 PM

## 2016-03-29 LAB — CBC
HCT: 30.7 % — ABNORMAL LOW (ref 36.0–46.0)
Hemoglobin: 10.2 g/dL — ABNORMAL LOW (ref 12.0–15.0)
MCH: 27.1 pg (ref 26.0–34.0)
MCHC: 33.2 g/dL (ref 30.0–36.0)
MCV: 81.6 fL (ref 78.0–100.0)
PLATELETS: 187 10*3/uL (ref 150–400)
RBC: 3.76 MIL/uL — ABNORMAL LOW (ref 3.87–5.11)
RDW: 13.5 % (ref 11.5–15.5)
WBC: 11.3 10*3/uL — ABNORMAL HIGH (ref 4.0–10.5)

## 2016-03-29 LAB — RPR: RPR: NONREACTIVE

## 2016-03-29 MED ORDER — IBUPROFEN 600 MG PO TABS: 600.0000 mg | ORAL_TABLET | Freq: Four times a day (QID) | ORAL | Status: AC

## 2016-03-29 NOTE — Lactation Note (Signed)
This note was copied from a baby's chart. Lactation Consultation Note Experienced BF mom BF in cradle position w/o difficulty. Baby has bad bruised face from delivery. Encouraged BF and breast massage during feedings. Encouraged STS, I&O, newborn behaviors. Mom had good body posture and obtained a good latch. Encouraged to call for assistance if needed.  Patient Name: Carol Rush Reason for consult: Follow-up assessment   Maternal Data    Feeding Feeding Type: Breast Fed Length of feed: 10 min  LATCH Score/Interventions Latch: Grasps breast easily, tongue down, lips flanged, rhythmical sucking.  Audible Swallowing: None Intervention(s): Skin to skin;Hand expression  Type of Nipple: Everted at rest and after stimulation  Comfort (Breast/Nipple): Soft / non-tender     Hold (Positioning): No assistance needed to correctly position infant at breast.  LATCH Score: 8  Lactation Tools Discussed/Used     Consult Status Consult Status: Follow-up Date: 03/30/16 Follow-up type: In-patient    Carol Rush, Carol Rush Rush, 5:40 AM

## 2016-03-29 NOTE — Anesthesia Postprocedure Evaluation (Signed)
Anesthesia Post Note  Patient: Carol Rush  Procedure(s) Performed: * No procedures listed *  Patient location during evaluation: Mother Baby Anesthesia Type: Epidural Level of consciousness: awake and alert Pain management: pain level controlled Vital Signs Assessment: post-procedure vital signs reviewed and stable Respiratory status: spontaneous breathing Cardiovascular status: stable Postop Assessment: no headache, no backache, epidural receding and patient able to bend at knees Anesthetic complications: no     Last Vitals:  Filed Vitals:   03/28/16 2305 03/29/16 0650  BP: 113/68 114/75  Pulse: 68 67  Temp: 36.8 C 36.9 C  Resp: 20 20    Last Pain:  Filed Vitals:   03/29/16 0651  PainSc: 3    Pain Goal:                 Edison PaceWILKERSON,Jared Cahn

## 2016-03-29 NOTE — Lactation Note (Signed)
This note was copied from a baby's chart. Lactation Consultation Note  Patient Name: Carol Percell BeltLauren Aro ZOXWR'UToday's Date: 03/29/2016 Reason for consult: Follow-up assessment Baby is 6922 hours old and mom desires to go home early after 24 hours.  Baby has a bruised face form delivery and post circ. Per mom has been sleepy since this am.  Baby waking up while LC present. LC assisted with skin to skin and baby woke up and mom independently  Latched with depth. Multiply swallows noted, increased with breast compressions. Baby still feeding in at 10 mins.  Mom denies sore nipples, sore nipple and engorgement prevention and tx reviewed.  Per mom has  DEBP at home. LC also discussed with mom 2nd baby , usually increased volume, increased let down,  And when milk comes in may have to release fullness down on the 1st breast so the baby will be able to feed in a comfortable  Pattern with let down.  Mother informed of post-discharge support and given phone number to the lactation department, including services for phone call assistance; out-patient appointments; and breastfeeding support group. List of other breastfeeding resources in the community given in the handout. Encouraged mother to call for problems or concerns related to breastfeeding.Mother informed of post-discharge support and given phone number to the lactation department, including services for phone call assistance; out-patient appointments; and breastfeeding support group. List of other breastfeeding resources in the community given in the handout. Encouraged mother to call for problems or concerns related to breastfeeding.  Maternal Data Has patient been taught Hand Expression?: Yes  Feeding Feeding Type: Breast Fed  LATCH Score/Interventions Latch: Grasps breast easily, tongue down, lips flanged, rhythmical sucking.  Audible Swallowing: Spontaneous and intermittent  Type of Nipple: Everted at rest and after stimulation  Comfort  (Breast/Nipple): Soft / non-tender     Hold (Positioning): Assistance needed to correctly position infant at breast and maintain latch. Intervention(s): Breastfeeding basics reviewed;Support Pillows;Position options;Skin to skin  LATCH Score: 9  Lactation Tools Discussed/Used WIC Program: No   Consult Status Consult Status: Complete Date: 03/29/16 Follow-up type: In-patient    Kathrin Greathouseorio, Bayle Calvo Ann 03/29/2016, 1:35 PM

## 2016-03-29 NOTE — Discharge Summary (Signed)
Obstetric Discharge Summary Reason for Admission: induction of labor Prenatal Procedures: none Intrapartum Procedures: spontaneous vaginal delivery Postpartum Procedures: none Complications-Operative and Postpartum: none HEMOGLOBIN  Date Value Ref Range Status  03/29/2016 10.2* 12.0 - 15.0 g/dL Final   HCT  Date Value Ref Range Status  03/29/2016 30.7* 36.0 - 46.0 % Final    Physical Exam:  General: alert, cooperative and appears stated age 79Lochia: appropriate Uterine Fundus: firm Incision: healing well, no significant drainage, no dehiscence, no significant erythema DVT Evaluation: No evidence of DVT seen on physical exam.  Discharge Diagnoses: Term Pregnancy-delivered  Discharge Information: Date: 03/29/2016 Activity: pelvic rest Diet: routine Medications: Ibuprofen Condition: improved Instructions: refer to practice specific booklet Discharge to: home   Newborn Data: Live born female  Birth Weight: 9 lb 7.9 oz (4305 g) APGAR: 8, 9  Home with mother.  Dael Howland L 03/29/2016, 8:19 AM

## 2016-10-14 DIAGNOSIS — J0110 Acute frontal sinusitis, unspecified: Secondary | ICD-10-CM | POA: Diagnosis not present

## 2017-04-11 DIAGNOSIS — Z9289 Personal history of other medical treatment: Secondary | ICD-10-CM | POA: Diagnosis not present

## 2017-04-11 DIAGNOSIS — J029 Acute pharyngitis, unspecified: Secondary | ICD-10-CM | POA: Diagnosis not present

## 2017-07-11 DIAGNOSIS — Z01419 Encounter for gynecological examination (general) (routine) without abnormal findings: Secondary | ICD-10-CM | POA: Diagnosis not present

## 2017-07-11 DIAGNOSIS — Z6822 Body mass index (BMI) 22.0-22.9, adult: Secondary | ICD-10-CM | POA: Diagnosis not present

## 2018-03-12 DIAGNOSIS — M7041 Prepatellar bursitis, right knee: Secondary | ICD-10-CM | POA: Diagnosis not present

## 2018-05-22 DIAGNOSIS — M7041 Prepatellar bursitis, right knee: Secondary | ICD-10-CM | POA: Diagnosis not present

## 2018-05-30 DIAGNOSIS — R110 Nausea: Secondary | ICD-10-CM | POA: Diagnosis not present

## 2018-05-30 DIAGNOSIS — K219 Gastro-esophageal reflux disease without esophagitis: Secondary | ICD-10-CM | POA: Diagnosis not present

## 2018-05-30 DIAGNOSIS — R109 Unspecified abdominal pain: Secondary | ICD-10-CM | POA: Diagnosis not present

## 2018-12-19 DIAGNOSIS — Z01419 Encounter for gynecological examination (general) (routine) without abnormal findings: Secondary | ICD-10-CM | POA: Diagnosis not present

## 2018-12-19 DIAGNOSIS — Z6822 Body mass index (BMI) 22.0-22.9, adult: Secondary | ICD-10-CM | POA: Diagnosis not present

## 2018-12-26 DIAGNOSIS — R1032 Left lower quadrant pain: Secondary | ICD-10-CM | POA: Diagnosis not present

## 2019-05-07 DIAGNOSIS — F419 Anxiety disorder, unspecified: Secondary | ICD-10-CM | POA: Diagnosis not present

## 2019-08-06 DIAGNOSIS — Z23 Encounter for immunization: Secondary | ICD-10-CM | POA: Diagnosis not present

## 2019-08-07 DIAGNOSIS — F419 Anxiety disorder, unspecified: Secondary | ICD-10-CM | POA: Diagnosis not present

## 2019-08-07 DIAGNOSIS — Z79899 Other long term (current) drug therapy: Secondary | ICD-10-CM | POA: Diagnosis not present

## 2019-09-03 ENCOUNTER — Other Ambulatory Visit: Payer: Self-pay

## 2019-09-03 DIAGNOSIS — Z20822 Contact with and (suspected) exposure to covid-19: Secondary | ICD-10-CM

## 2019-09-04 LAB — NOVEL CORONAVIRUS, NAA: SARS-CoV-2, NAA: NOT DETECTED

## 2019-10-30 DIAGNOSIS — Z20828 Contact with and (suspected) exposure to other viral communicable diseases: Secondary | ICD-10-CM | POA: Diagnosis not present

## 2019-11-29 DIAGNOSIS — Z79899 Other long term (current) drug therapy: Secondary | ICD-10-CM | POA: Diagnosis not present

## 2019-11-29 DIAGNOSIS — R5383 Other fatigue: Secondary | ICD-10-CM | POA: Diagnosis not present

## 2019-11-29 DIAGNOSIS — F419 Anxiety disorder, unspecified: Secondary | ICD-10-CM | POA: Diagnosis not present

## 2019-12-05 DIAGNOSIS — R5383 Other fatigue: Secondary | ICD-10-CM | POA: Diagnosis not present

## 2020-02-20 DIAGNOSIS — K032 Erosion of teeth: Secondary | ICD-10-CM | POA: Diagnosis not present

## 2020-02-20 DIAGNOSIS — R21 Rash and other nonspecific skin eruption: Secondary | ICD-10-CM | POA: Diagnosis not present

## 2020-02-20 DIAGNOSIS — R140 Abdominal distension (gaseous): Secondary | ICD-10-CM | POA: Diagnosis not present

## 2020-02-20 DIAGNOSIS — M2550 Pain in unspecified joint: Secondary | ICD-10-CM | POA: Diagnosis not present

## 2020-11-12 ENCOUNTER — Other Ambulatory Visit: Payer: Self-pay | Admitting: Family Medicine

## 2020-11-12 ENCOUNTER — Ambulatory Visit
Admission: RE | Admit: 2020-11-12 | Discharge: 2020-11-12 | Disposition: A | Discharge status: Home / Self Care | Payer: BLUE CROSS/BLUE SHIELD | Source: Ambulatory Visit | Attending: Family Medicine | Admitting: Family Medicine

## 2020-11-12 DIAGNOSIS — R42 Dizziness and giddiness: Secondary | ICD-10-CM | POA: Diagnosis not present

## 2020-11-12 DIAGNOSIS — S0083XA Contusion of other part of head, initial encounter: Secondary | ICD-10-CM | POA: Diagnosis not present

## 2020-11-12 DIAGNOSIS — R110 Nausea: Secondary | ICD-10-CM | POA: Diagnosis not present

## 2020-11-12 DIAGNOSIS — R519 Headache, unspecified: Secondary | ICD-10-CM | POA: Diagnosis not present

## 2020-11-12 DIAGNOSIS — H538 Other visual disturbances: Secondary | ICD-10-CM | POA: Diagnosis not present

## 2020-11-12 DIAGNOSIS — F0781 Postconcussional syndrome: Secondary | ICD-10-CM | POA: Diagnosis not present

## 2021-04-30 DIAGNOSIS — J019 Acute sinusitis, unspecified: Secondary | ICD-10-CM | POA: Diagnosis not present

## 2021-08-31 DIAGNOSIS — M25532 Pain in left wrist: Secondary | ICD-10-CM | POA: Diagnosis not present

## 2021-11-16 DIAGNOSIS — J029 Acute pharyngitis, unspecified: Secondary | ICD-10-CM | POA: Diagnosis not present

## 2021-11-16 DIAGNOSIS — J0140 Acute pansinusitis, unspecified: Secondary | ICD-10-CM | POA: Diagnosis not present

## 2021-11-16 DIAGNOSIS — R0981 Nasal congestion: Secondary | ICD-10-CM | POA: Diagnosis not present

## 2022-01-12 DIAGNOSIS — J019 Acute sinusitis, unspecified: Secondary | ICD-10-CM | POA: Diagnosis not present

## 2022-01-12 DIAGNOSIS — J0140 Acute pansinusitis, unspecified: Secondary | ICD-10-CM | POA: Diagnosis not present
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
## 2023-01-06 DIAGNOSIS — N920 Excessive and frequent menstruation with regular cycle: Secondary | ICD-10-CM | POA: Diagnosis not present

## 2023-01-06 DIAGNOSIS — Z124 Encounter for screening for malignant neoplasm of cervix: Secondary | ICD-10-CM | POA: Diagnosis not present

## 2023-01-06 DIAGNOSIS — Z0000 Encounter for general adult medical examination without abnormal findings: Secondary | ICD-10-CM | POA: Diagnosis not present

## 2023-10-16 DIAGNOSIS — Z6823 Body mass index (BMI) 23.0-23.9, adult: Secondary | ICD-10-CM | POA: Diagnosis not present

## 2023-10-16 DIAGNOSIS — R051 Acute cough: Secondary | ICD-10-CM | POA: Diagnosis not present

## 2024-05-22 DIAGNOSIS — R300 Dysuria: Secondary | ICD-10-CM | POA: Diagnosis not present

## 2024-05-22 DIAGNOSIS — R350 Frequency of micturition: Secondary | ICD-10-CM | POA: Diagnosis not present

## 2024-05-22 DIAGNOSIS — M545 Low back pain, unspecified: Secondary | ICD-10-CM | POA: Diagnosis not present

## 2024-05-22 DIAGNOSIS — Z6822 Body mass index (BMI) 22.0-22.9, adult: Secondary | ICD-10-CM | POA: Diagnosis not present

## 2024-08-13 DIAGNOSIS — L814 Other melanin hyperpigmentation: Secondary | ICD-10-CM | POA: Diagnosis not present

## 2024-08-13 DIAGNOSIS — D239 Other benign neoplasm of skin, unspecified: Secondary | ICD-10-CM | POA: Diagnosis not present

## 2024-08-13 DIAGNOSIS — D225 Melanocytic nevi of trunk: Secondary | ICD-10-CM | POA: Diagnosis not present

## 2024-08-13 DIAGNOSIS — L578 Other skin changes due to chronic exposure to nonionizing radiation: Secondary | ICD-10-CM | POA: Diagnosis not present

## 2024-08-13 DIAGNOSIS — Z23 Encounter for immunization: Secondary | ICD-10-CM | POA: Diagnosis not present

## 2024-08-13 DIAGNOSIS — D485 Neoplasm of uncertain behavior of skin: Secondary | ICD-10-CM | POA: Diagnosis not present

## 2024-10-03 DIAGNOSIS — D485 Neoplasm of uncertain behavior of skin: Secondary | ICD-10-CM | POA: Diagnosis not present
